# Patient Record
Sex: Male | Born: 1995 | Race: Black or African American | Hispanic: No | Marital: Single | Smoking: Current every day smoker
Health system: Southern US, Community
[De-identification: ages and names within clinical notes are randomized; demographics above are authoritative.]

---
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
## 2019-04-18 ENCOUNTER — Emergency Department (HOSPITAL_COMMUNITY)
Admission: EM | Admit: 2019-04-18 | Discharge: 2019-04-18 | Disposition: A | Discharge status: Home / Self Care | Payer: BC Managed Care – PPO | Attending: Emergency Medicine | Admitting: Emergency Medicine

## 2019-04-18 ENCOUNTER — Encounter (HOSPITAL_COMMUNITY): Payer: Self-pay | Admitting: Emergency Medicine

## 2019-04-18 ENCOUNTER — Emergency Department (HOSPITAL_COMMUNITY): Payer: BC Managed Care – PPO

## 2019-04-18 ENCOUNTER — Other Ambulatory Visit: Payer: Self-pay

## 2019-04-18 DIAGNOSIS — R070 Pain in throat: Secondary | ICD-10-CM | POA: Diagnosis present

## 2019-04-18 DIAGNOSIS — R509 Fever, unspecified: Secondary | ICD-10-CM | POA: Diagnosis not present

## 2019-04-18 DIAGNOSIS — J020 Streptococcal pharyngitis: Secondary | ICD-10-CM | POA: Diagnosis not present

## 2019-04-18 DIAGNOSIS — F17200 Nicotine dependence, unspecified, uncomplicated: Secondary | ICD-10-CM | POA: Diagnosis not present

## 2019-04-18 DIAGNOSIS — Z20828 Contact with and (suspected) exposure to other viral communicable diseases: Secondary | ICD-10-CM | POA: Diagnosis not present

## 2019-04-18 DIAGNOSIS — R05 Cough: Secondary | ICD-10-CM

## 2019-04-18 DIAGNOSIS — R059 Cough, unspecified: Secondary | ICD-10-CM

## 2019-04-18 LAB — SARS CORONAVIRUS 2 (TAT 6-24 HRS): SARS Coronavirus 2: NEGATIVE

## 2019-04-18 LAB — GROUP A STREP BY PCR: Group A Strep by PCR: DETECTED — AB

## 2019-04-18 MED ORDER — DEXAMETHASONE 4 MG PO TABS
10.0000 mg | ORAL_TABLET | Freq: Once | ORAL | Status: AC
  Administered 2019-04-18: 10 mg via ORAL
  Filled 2019-04-18: qty 2

## 2019-04-18 MED ORDER — ACETAMINOPHEN 325 MG PO TABS
650.0000 mg | ORAL_TABLET | Freq: Once | ORAL | Status: AC | PRN
  Administered 2019-04-18: 03:00:00 650 mg via ORAL
  Filled 2019-04-18: qty 2

## 2019-04-18 MED ORDER — AMOXICILLIN 500 MG PO CAPS: 1000.0000 mg | ORAL_CAPSULE | Freq: Two times a day (BID) | ORAL | 0 refills | Status: AC

## 2019-04-18 MED ORDER — SODIUM CHLORIDE 0.9% FLUSH: 3.0000 mL | Freq: Once | INTRAVENOUS | Status: DC

## 2019-04-18 NOTE — Discharge Instructions (Addendum)
Throw away your toothbrush in a week.    Even though it hurts you need to continue to eat and drink.   Take 4 over the counter ibuprofen tablets 3 times a day or 2 over-the-counter naproxen tablets twice a day for pain. Also take tylenol 1000mg (2 extra strength) four times a day.

## 2019-04-18 NOTE — ED Triage Notes (Signed)
Patient here from home with complaints of fever and sore throat for 3 days. "would like to be tested for covid".

## 2019-04-18 NOTE — ED Provider Notes (Addendum)
[facility] DEPT Provider Note   CSN: [id number] Arrival date & time: 04/18/19  0303    History   Chief Complaint Chief Complaint  Patient presents with  . Fever  . Sore Throat    HPI Justin Leblanc is a 23 y.o. male.     23 yo M with a cc of sore throat and fever.  Going on for the past 48 hours or so.  Feels that his throat is swollen he has painful swallowing.  Worse on the left than the right.  No cough.  No sick contacts.  No shortness of breath no abdominal pain no vomiting or diarrhea.  The history is provided by the patient.  Fever Associated symptoms: sore throat   Associated symptoms: no chest pain, no chills, no confusion, no congestion, no diarrhea, no headaches, no myalgias, no rash and no vomiting   Sore Throat Pertinent negatives include no chest pain, no abdominal pain, no headaches and no shortness of breath.  Illness Severity:  Moderate Onset quality:  Gradual Duration:  2 days Timing:  Constant Progression:  Worsening Chronicity:  New Associated symptoms: fever and sore throat   Associated symptoms: no abdominal pain, no chest pain, no congestion, no diarrhea, no headaches, no myalgias, no rash, no shortness of breath and no vomiting     History reviewed. No pertinent past medical history.  There are no active problems to display for this patient.   History reviewed. No pertinent surgical history.      Home Medications    Prior to Admission medications   Medication Sig Start Date End Date Taking? Authorizing Provider  amoxicillin (AMOXIL) 500 MG capsule Take 2 capsules (1,000 mg total) by mouth 2 (two) times daily. 04/18/19   Deno Etienne, DO  cyclobenzaprine (FLEXERIL) 5 MG tablet Take 1 tablet (5 mg total) by mouth 2 (two) times daily as needed for muscle spasms. 12/02/14   Larence Penning, MD  naproxen (NAPROSYN) 375 MG tablet Take 1 tablet (375 mg total) by mouth 2 (two) times daily with a meal. 12/02/14   Larence Penning,  MD    Family History No family history on file.  Social History Social History   Tobacco Use  . Smoking status: Current Every Day Smoker  . Smokeless tobacco: Never Used  Substance Use Topics  . Alcohol use: Not on file  . Drug use: Not on file     Allergies   Patient has no allergy information on record.   Review of Systems Review of Systems  Constitutional: Positive for fever. Negative for chills.  HENT: Positive for sore throat. Negative for congestion and facial swelling.   Eyes: Negative for discharge and visual disturbance.  Respiratory: Negative for shortness of breath.   Cardiovascular: Negative for chest pain and palpitations.  Gastrointestinal: Negative for abdominal pain, diarrhea and vomiting.  Musculoskeletal: Negative for arthralgias and myalgias.  Skin: Negative for color change and rash.  Neurological: Negative for tremors, syncope and headaches.  Psychiatric/Behavioral: Negative for confusion and dysphoric mood.     Physical Exam Updated Vital Signs BP 117/74 (BP Location: Left Arm)   Pulse 89   Temp (!) 101.1 F (38.4 C) (Oral)   Resp 20   SpO2 99%   Physical Exam Vitals signs and nursing note reviewed.  Constitutional:      Appearance: He is well-developed.  HENT:     Head: Normocephalic and atraumatic.     Comments: L anterior cervical lymphadenopathy.  Left-sided tonsillar swelling  with exudates.  Uvula is midline.  Tolerating secretions without difficulty. Eyes:     Pupils: Pupils are equal, round, and reactive to light.  Neck:     Musculoskeletal: Normal range of motion and neck supple.     Vascular: No JVD.  Cardiovascular:     Rate and Rhythm: Normal rate and regular rhythm.     Heart sounds: No murmur. No friction rub. No gallop.   Pulmonary:     Effort: No respiratory distress.     Breath sounds: No wheezing.  Abdominal:     General: There is no distension.     Tenderness: There is no guarding or rebound.  Musculoskeletal:  Normal range of motion.  Skin:    Coloration: Skin is not pale.     Findings: No rash.  Neurological:     Mental Status: He is alert and oriented to person, place, and time.  Psychiatric:        Behavior: Behavior normal.      ED Treatments / Results  Labs (all labs ordered are listed, but only abnormal results are displayed) Labs Reviewed  GROUP A STREP BY PCR - Abnormal; Notable for the following components:      Result Value   Group A Strep by PCR DETECTED (*)    All other components within normal limits    EKG None  Radiology Dg Chest Port 1 View  Result Date: 04/18/2019 CLINICAL DATA:  Cough, fever, body aches. Weakness. EXAM: PORTABLE CHEST 1 VIEW COMPARISON:  None. FINDINGS: The cardiomediastinal contours are normal. The lungs are clear. Pulmonary vasculature is normal. No consolidation, pleural effusion, or pneumothorax. No acute osseous abnormalities are seen. IMPRESSION: Negative AP view of the chest. Electronically Signed   By: Narda RutherfordMelanie  Sanford M.D.   On: 04/18/2019 03:42    Procedures Procedures (including critical care time)  Medications Ordered in ED Medications  sodium chloride flush (NS) 0.9 % injection 3 mL (has no administration in time range)  dexamethasone (DECADRON) tablet 10 mg (has no administration in time range)  acetaminophen (TYLENOL) tablet 650 mg (650 mg Oral Given 04/18/19 0321)     Initial Impression / Assessment and Plan / ED Course  I have reviewed the triage vital signs and the nursing notes.  Pertinent labs & imaging results that were available during my care of the patient were reviewed by me and considered in my medical decision making (see chart for details).        23 yo M with a chief complaint of a sore throat and fever.  Patient is well-appearing and nontoxic.  Clinically he has strep pharyngitis with 4 out of 4 of the centor criteria.  He had a rapid strep that was drawn in triage that is pending.  He also had multiple lab  orders that were pending I do not feel that these are warranted with him being well-appearing and nontoxic.  Strep +. Patient requesting covid test.   Justin Leblanc was evaluated in Emergency Department on 04/18/2019 for the symptoms described in the history of present illness. He/she was evaluated in the context of the global COVID-19 pandemic, which necessitated consideration that the patient might be at risk for infection with the SARS-CoV-2 virus that causes COVID-19. Institutional protocols and algorithms that pertain to the evaluation of patients at risk for COVID-19 are in a state of rapid change based on information released by regulatory bodies including the CDC and federal and state organizations. These policies and algorithms were followed  during the patient's care in the ED.   4:07 AM:  I have discussed the diagnosis/risks/treatment options with the patient and believe the pt to be eligible for discharge home to follow-up with PCP. We also discussed returning to the ED immediately if new or worsening sx occur. We discussed the sx which are most concerning (e.g., sudden worsening pain, fever, inability to tolerate by mouth) that necessitate immediate return. Medications administered to the patient during their visit and any new prescriptions provided to the patient are listed below.  Medications given during this visit Medications  sodium chloride flush (NS) 0.9 % injection 3 mL (has no administration in time range)  dexamethasone (DECADRON) tablet 10 mg (has no administration in time range)  acetaminophen (TYLENOL) tablet 650 mg (650 mg Oral Given 04/18/19 0321)     The patient appears reasonably screen and/or stabilized for discharge and I doubt any other medical condition or other [facility] requiring further screening, evaluation, or treatment in the ED at this time prior to discharge.    Final Clinical Impressions(s) / ED Diagnoses   Final diagnoses:  Strep pharyngitis    ED  Discharge Orders         Ordered    amoxicillin (AMOXIL) 500 MG capsule  2 times daily     04/18/19 0405               Melene PlanFloyd, Maleigh Bagot, DO 04/18/19 0408

## 2020-08-25 IMAGING — DX PORTABLE CHEST - 1 VIEW
1 series · 2 of 2 positions shown · non-contrast
Comparison: None.

CLINICAL DATA: Cough, fever, body aches. Weakness.

EXAM:
PORTABLE CHEST 1 VIEW

[Series 1: chest ap · 0.14mm/px · 2 of 2 slices shown]
[im 1/2]
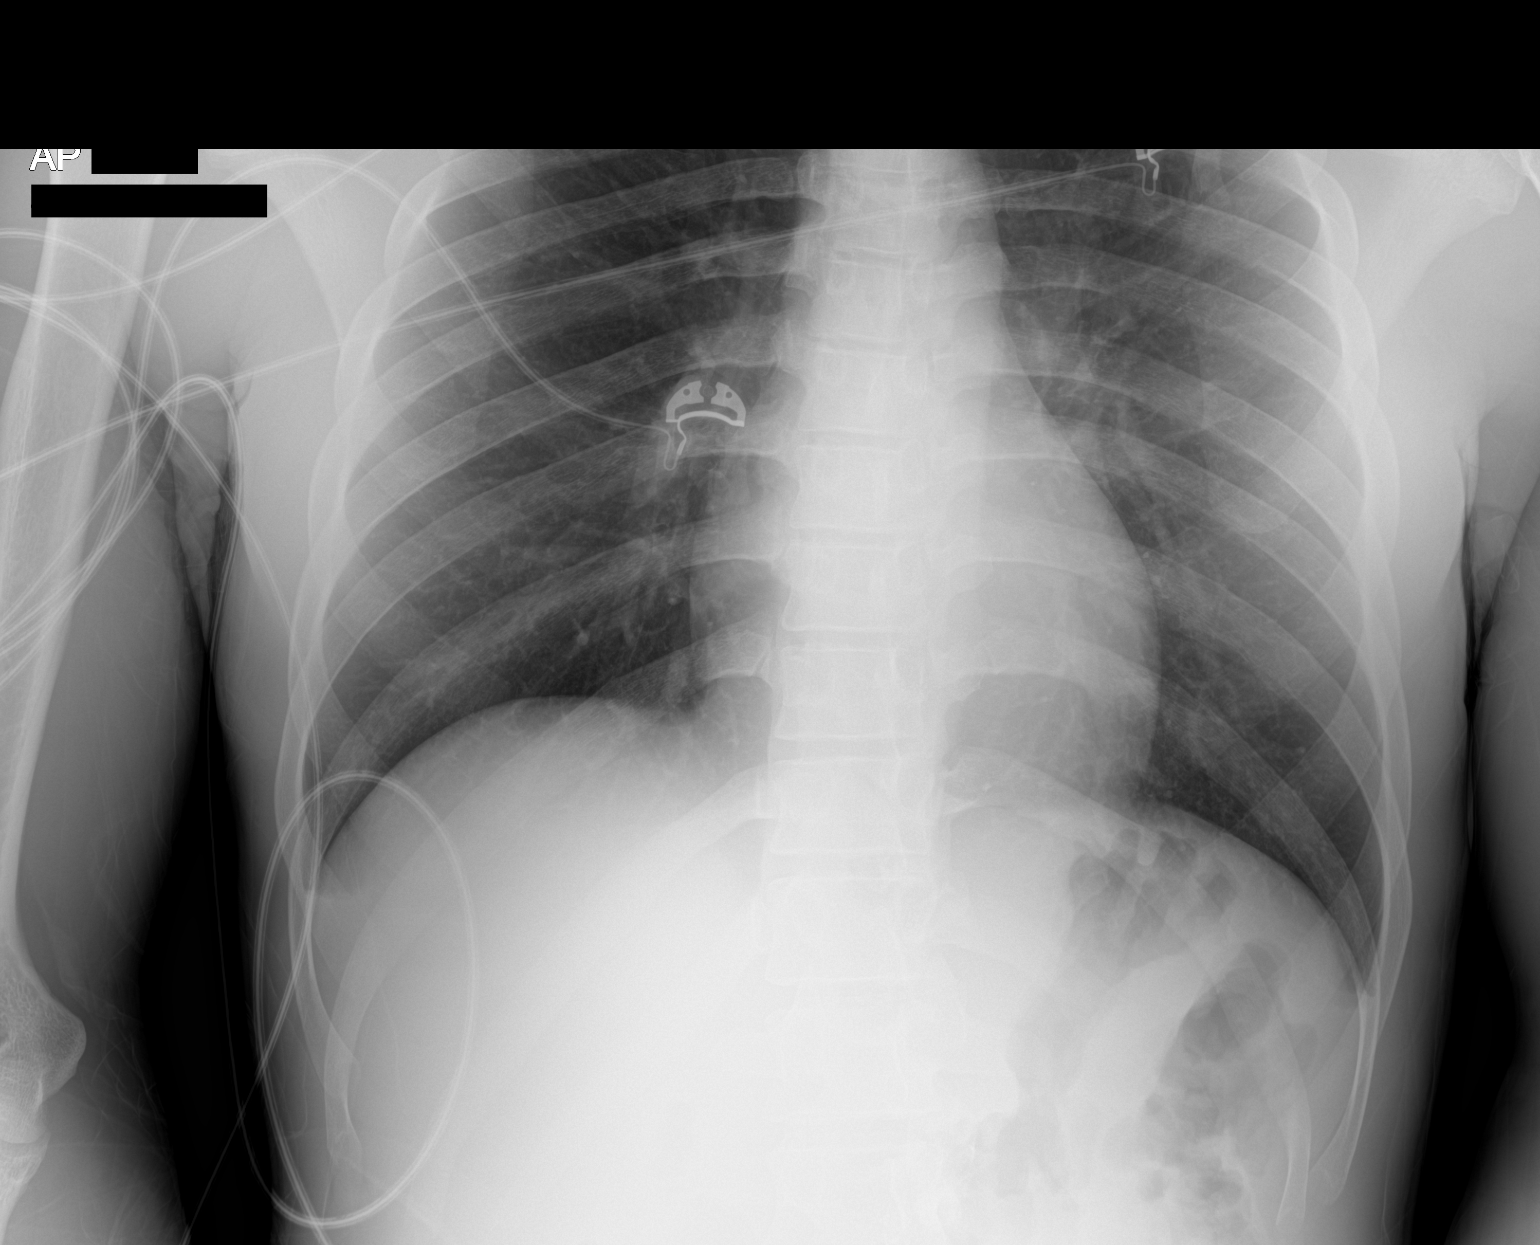
[im 2/2]
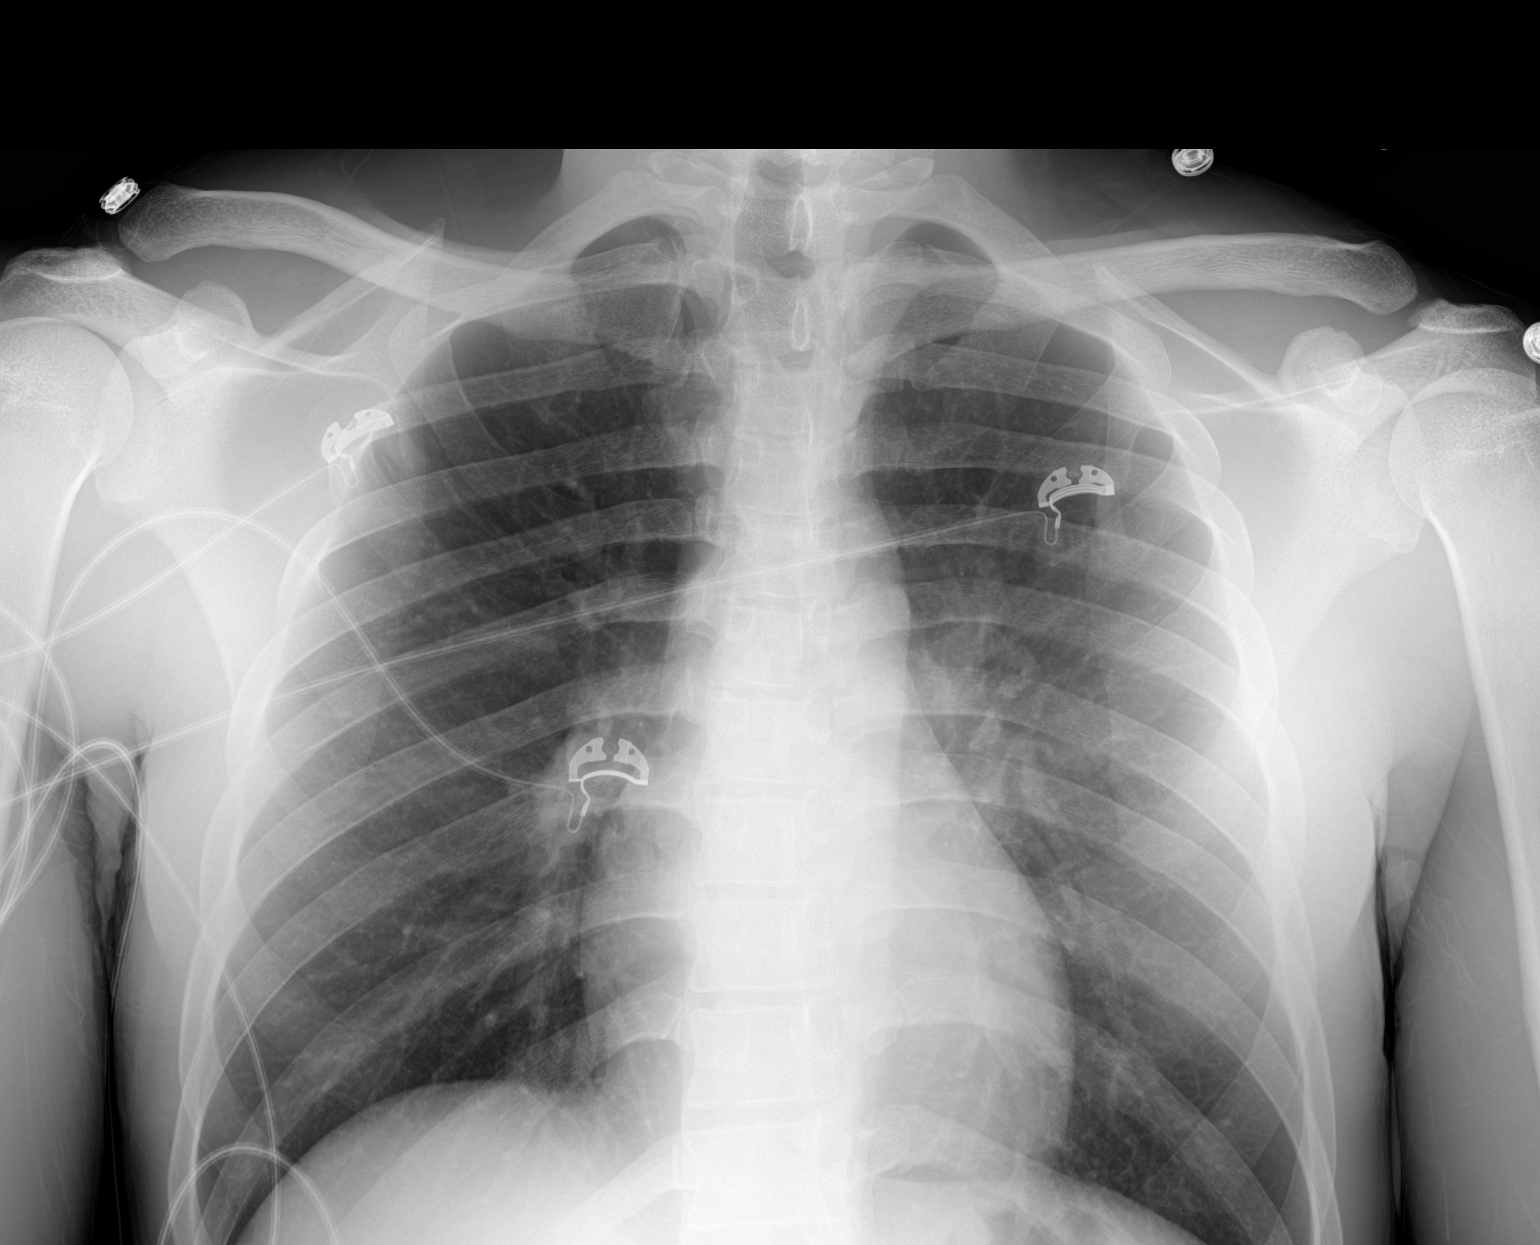

[2 of 2 positions shown; findings below may reference images not displayed]

FINDINGS: The cardiomediastinal contours are normal. The lungs are clear.
Pulmonary vasculature is normal. No consolidation, pleural effusion,
or pneumothorax. No acute osseous abnormalities are seen.
IMPRESSION: Negative AP view of the chest.
